# Patient Record
Sex: Female | Born: 1955 | Race: White | Hispanic: No | Marital: Married | Smoking: Current every day smoker
Health system: Southern US, Community
[De-identification: ages and names within clinical notes are randomized; demographics above are authoritative.]

## PROBLEM LIST (undated history)

## (undated) DIAGNOSIS — M1990 Unspecified osteoarthritis, unspecified site: Secondary | ICD-10-CM

## (undated) DIAGNOSIS — E785 Hyperlipidemia, unspecified: Secondary | ICD-10-CM

## (undated) DIAGNOSIS — F329 Major depressive disorder, single episode, unspecified: Secondary | ICD-10-CM

## (undated) DIAGNOSIS — F419 Anxiety disorder, unspecified: Secondary | ICD-10-CM

## (undated) DIAGNOSIS — F32A Depression, unspecified: Secondary | ICD-10-CM

## (undated) HISTORY — DX: Anxiety disorder, unspecified: F41.9

## (undated) HISTORY — DX: Depression, unspecified: F32.A

## (undated) HISTORY — DX: Unspecified osteoarthritis, unspecified site: M19.90

## (undated) HISTORY — DX: Hyperlipidemia, unspecified: E78.5

## (undated) HISTORY — PX: OTHER SURGICAL HISTORY: SHX169

---
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
## 2019-08-21 ENCOUNTER — Encounter: Payer: Self-pay | Admitting: Gastroenterology

## 2019-09-18 ENCOUNTER — Encounter: Payer: Self-pay | Admitting: Gastroenterology

## 2019-09-18 ENCOUNTER — Ambulatory Visit (INDEPENDENT_AMBULATORY_CARE_PROVIDER_SITE_OTHER): Payer: 59 | Admitting: Gastroenterology

## 2019-09-18 ENCOUNTER — Other Ambulatory Visit: Payer: Self-pay

## 2019-09-18 VITALS — BP 122/80 | HR 64 | Temp 97.5°F | Ht 66.5 in | Wt 137.2 lb

## 2019-09-18 DIAGNOSIS — Z01818 Encounter for other preprocedural examination: Secondary | ICD-10-CM

## 2019-09-18 DIAGNOSIS — R195 Other fecal abnormalities: Secondary | ICD-10-CM

## 2019-09-18 NOTE — Progress Notes (Signed)
Chief Complaint: heme positive stools  Referring Provider:  Willeen Niece, PA      ASSESSMENT AND PLAN;   #1.  Heme positive stools with Nl CBC 08/2019 #2.  Comorbid conditions include COPD, anxiety/depression, psoriasis, HLD.  Plan:  - Proceed with colonoscopy. Discussed risks & benefits. (Risks including rare perforation req laparotomy, bleeding after bx/polypectomy req blood transfusion, rarely missing neoplasms, risks of anesthesia/sedation). Benefits outweigh the risks. Patient agrees to proceed. All the questions were answered. Consent forms given for review. -I have instructed patient to stop smoking.  Have discussed risks associated with smoking including risks of various cancers.     HPI:    Sydney House is a 64 y.o. female  With heme positive stools. No nausea, vomiting, heartburn, regurgitation, odynophagia or dysphagia.  No significant diarrhea or constipation.  No melena or hematochezia. No unintentional weight loss. No abdominal pain.  Has left flank pain, not related to BMs, better with flexaril.  Not planning to get Covid shots (I have recommended to get those done-she will discuss with her husband)  Continued smoking despite of medical advice.  No family history of colon cancer or colonic polyps.  Past Medical History:  Diagnosis Date  . Anxiety   . Depression   . Hyperlipidemia     History reviewed. No pertinent surgical history.  Family History  Problem Relation Age of Onset  . Diabetes Sister   . Heart disease Sister   . Diabetes Brother   . Esophageal cancer Neg Hx   . Colon cancer Neg Hx     Social History   Tobacco Use  . Smoking status: Current Every Day Smoker  . Smokeless tobacco: Never Used  Substance Use Topics  . Alcohol use: Not Currently  . Drug use: Never    Current Outpatient Medications  Medication Sig Dispense Refill  . atorvastatin (LIPITOR) 40 MG tablet Take 40 mg by mouth daily.    . busPIRone (BUSPAR) 10 MG  tablet Take 10 mg by mouth 2 (two) times daily.    . cyclobenzaprine (FLEXERIL) 10 MG tablet Take 10 mg by mouth daily as needed.    Marland Kitchen FLUoxetine (PROZAC) 20 MG capsule Take 20 mg by mouth daily.    . fluticasone (FLONASE) 50 MCG/ACT nasal spray Place 1 spray into both nostrils daily.    . meclizine (ANTIVERT) 12.5 MG tablet Take 12.5 mg by mouth daily as needed.     No current facility-administered medications for this visit.    Not on File  Review of Systems:  Constitutional: Denies fever, chills, diaphoresis, appetite change and fatigue.  HEENT: Denies photophobia, eye pain, redness, hearing loss, ear pain, congestion, sore throat, rhinorrhea, sneezing, mouth sores, neck pain, neck stiffness and tinnitus.   Respiratory: Denies SOB, DOE, cough, chest tightness,  and wheezing.   Cardiovascular: Denies chest pain, palpitations and leg swelling.  Genitourinary: Denies dysuria, urgency, frequency, hematuria, flank pain and difficulty urinating.  Musculoskeletal: Denies myalgias, back pain, joint swelling, arthralgias and gait problem.  Skin: rash.  Neurological: Denies dizziness, seizures, syncope, weakness, light-headedness, numbness and headaches.  Hematological: Denies adenopathy. Easy bruising, personal or family bleeding history  Psychiatric/Behavioral: Has anxiety or depression     Physical Exam:    BP 122/80   Pulse 64   Temp (!) 97.5 F (36.4 C)   Ht 5' 6.5" (1.689 m)   Wt 137 lb 4 oz (62.3 kg)   BMI 21.82 kg/m  Wt Readings from Last 3 Encounters:  09/18/19 137 lb 4 oz (62.3 kg)   Constitutional:  Well-developed, in no acute distress. Psychiatric: Normal mood and affect. Behavior is normal. HEENT: Pupils normal.  Conjunctivae are normal. No scleral icterus. Neck supple.  Cardiovascular: Normal rate, regular rhythm. No edema Pulmonary/chest: Effort normal and breath sounds decreased.  No wheezing, rales or rhonchi. Abdominal: Soft, nondistended. Nontender. Bowel  sounds active throughout. There are no masses palpable. No hepatomegaly. Small umblical hernia Rectal:  defered Neurological: Alert and oriented to person place and time. Skin: Skin is warm and dry. No rashes noted.  Data Reviewed: I have personally reviewed following labs and imaging studies     Carmell Austria, MD 09/18/2019, 9:00 AM  Cc: Willeen Niece, [place]

## 2019-09-18 NOTE — Patient Instructions (Signed)
If you are age 62 or older, your body mass index should be between 23-30. Your Body mass index is 21.82 kg/m. If this is out of the aforementioned range listed, please consider follow up with your Primary Care Provider.  If you are age 48 or younger, your body mass index should be between 19-25. Your Body mass index is 21.82 kg/m. If this is out of the aformentioned range listed, please consider follow up with your Primary Care Provider.   You have been scheduled for a colonoscopy. Please follow written instructions given to you at your visit today.  Please pick up your prep supplies at the pharmacy within the next 1-3 days. If you use inhalers (even only as needed), please bring them with you on the day of your procedure. Your physician has requested that you go to www.startemmi.com and enter the access code given to you at your visit today. This web site gives a general overview about your procedure. However, you should still follow specific instructions given to you by our office regarding your preparation for the procedure.  Thank you,  Dr. Jackquline Denmark

## 2019-10-16 ENCOUNTER — Ambulatory Visit (INDEPENDENT_AMBULATORY_CARE_PROVIDER_SITE_OTHER): Payer: 59

## 2019-10-16 ENCOUNTER — Other Ambulatory Visit: Payer: Self-pay | Admitting: Gastroenterology

## 2019-10-16 DIAGNOSIS — Z1159 Encounter for screening for other viral diseases: Secondary | ICD-10-CM

## 2019-10-16 LAB — SARS CORONAVIRUS 2 (TAT 6-24 HRS): SARS Coronavirus 2: NEGATIVE

## 2019-10-18 ENCOUNTER — Ambulatory Visit (AMBULATORY_SURGERY_CENTER): Payer: 59 | Admitting: Gastroenterology

## 2019-10-18 ENCOUNTER — Encounter: Payer: Self-pay | Admitting: Gastroenterology

## 2019-10-18 ENCOUNTER — Other Ambulatory Visit: Payer: Self-pay

## 2019-10-18 VITALS — BP 112/65 | HR 68 | Temp 98.3°F | Resp 20 | Ht 66.5 in | Wt 137.0 lb

## 2019-10-18 DIAGNOSIS — K5730 Diverticulosis of large intestine without perforation or abscess without bleeding: Secondary | ICD-10-CM

## 2019-10-18 DIAGNOSIS — D125 Benign neoplasm of sigmoid colon: Secondary | ICD-10-CM

## 2019-10-18 DIAGNOSIS — R195 Other fecal abnormalities: Secondary | ICD-10-CM

## 2019-10-18 MED ORDER — SODIUM CHLORIDE 0.9 % IV SOLN: 500.0000 mL | Freq: Once | INTRAVENOUS | Status: DC

## 2019-10-18 NOTE — Progress Notes (Signed)
TEMP-LS  V/S-DT

## 2019-10-18 NOTE — Progress Notes (Signed)
Report given to PACU, vss 

## 2019-10-18 NOTE — Patient Instructions (Signed)
YOU HAD AN ENDOSCOPIC PROCEDURE TODAY AT THE [facility]:   Refer to the procedure report that was given to you for any specific questions about what was found during the examination.  If the procedure report does not answer your questions, please call your gastroenterologist to clarify.  If you requested that your care partner not be given the details of your procedure findings, then the procedure report has been included in a sealed envelope for you to review at your convenience later.  YOU SHOULD EXPECT: Some feelings of bloating in the abdomen. Passage of more gas than usual.  Walking can help get rid of the air that was put into your GI tract during the procedure and reduce the bloating. If you had a lower endoscopy (such as a colonoscopy or flexible sigmoidoscopy) you may notice spotting of blood in your stool or on the toilet paper. If you underwent a bowel prep for your procedure, you may not have a normal bowel movement for a few days.  Please Note:  You might notice some irritation and congestion in your nose or some drainage.  This is from the oxygen used during your procedure.  There is no need for concern and it should clear up in a day or so.  SYMPTOMS TO REPORT IMMEDIATELY:   Following lower endoscopy (colonoscopy or flexible sigmoidoscopy):  Excessive amounts of blood in the stool  Significant tenderness or worsening of abdominal pains  Swelling of the abdomen that is new, acute  Fever of 100F or higher  For urgent or emergent issues, a gastroenterologist can be reached at any hour by calling [phone]. Do not use MyChart messaging for urgent concerns.    DIET:  We do recommend a small meal at first, but then you may proceed to your regular diet.  Drink plenty of fluids but you should avoid alcoholic beverages for 24 hours.  ACTIVITY:  You should plan to take it easy for the rest of today and you should NOT DRIVE or use heavy machinery until tomorrow (because  of the sedation medicines used during the test).    FOLLOW UP: Our staff will call the number listed on your records 48-72 hours following your procedure to check on you and address any questions or concerns that you may have regarding the information given to you following your procedure. If we do not reach you, we will leave a message.  We will attempt to reach you two times.  During this call, we will ask if you have developed any symptoms of COVID 19. If you develop any symptoms (ie: fever, flu-like symptoms, shortness of breath, cough etc.) before then, please call [phone].  If you test positive for Covid 19 in the 2 weeks post procedure, please call and report this information to us.    If any biopsies were taken you will be contacted by phone or by letter within the next 1-3 weeks.  Please call us at [phone] if you have not heard about the biopsies in 3 weeks.    SIGNATURES/CONFIDENTIALITY: You and/or your care partner have signed paperwork which will be entered into your electronic medical record.  These signatures attest to the fact that that the information above on your After Visit Summary has been reviewed and is understood.  Full responsibility of the confidentiality of this discharge information lies with you and/or your care-partner. 

## 2019-10-18 NOTE — Progress Notes (Signed)
Called to room to assist during endoscopic procedure.  Patient ID and intended procedure confirmed with present staff. Received instructions for my participation in the procedure from the performing physician.  

## 2019-10-19 ENCOUNTER — Telehealth: Payer: Self-pay | Admitting: Gastroenterology

## 2019-10-19 NOTE — Telephone Encounter (Signed)
Patient called stated she woke up with a lot of back pain and she accidentally took aleve instead of tylenol please advise

## 2019-10-19 NOTE — Telephone Encounter (Signed)
Returned pts call.  She states that she took one aleve for back pain this morning.  She reports no bleeding at this time.  Advised her to not take any additional NSAIDS.  Pt verbalized understanding.

## 2019-10-20 ENCOUNTER — Telehealth: Payer: Self-pay

## 2019-10-20 NOTE — Telephone Encounter (Signed)
  Follow up Call-  Call back number 10/18/2019  Post procedure Call Back phone  # [phone]  Permission to leave phone message Yes     Patient questions:  Do you have a fever, pain , or abdominal swelling? No. Pain Score  0 *  Have you tolerated food without any problems? Yes.    Have you been able to return to your normal activities? Yes.    Do you have any questions about your discharge instructions: Diet   No. Medications  No. Follow up visit  No.  Do you have questions or concerns about your Care? No.  Actions: * If pain score is 4 or above: No action needed, pain <4.  1. Have you developed a fever since your procedure? No  2.   Have you had an respiratory symptoms (SOB or cough) since your procedure? No  3.   Have you tested positive for COVID 19 since your procedure No  4.   Have you had any family members/close contacts diagnosed with the COVID 19 since your procedure?  No  If yes to any of these questions please route to Joylene John, RN and Erenest Rasher, RN

## 2019-10-29 ENCOUNTER — Encounter: Payer: Self-pay | Admitting: Gastroenterology

## 2020-01-09 ENCOUNTER — Ambulatory Visit: Payer: 59 | Admitting: Gastroenterology

## 2021-01-15 ENCOUNTER — Ambulatory Visit: Payer: 59 | Admitting: Gastroenterology

## 2021-06-14 ENCOUNTER — Emergency Department (HOSPITAL_BASED_OUTPATIENT_CLINIC_OR_DEPARTMENT_OTHER)
Admission: EM | Admit: 2021-06-14 | Discharge: 2021-06-14 | Disposition: A | Discharge status: Home / Self Care | Payer: BLUE CROSS/BLUE SHIELD | Attending: Emergency Medicine | Admitting: Emergency Medicine

## 2021-06-14 ENCOUNTER — Emergency Department (HOSPITAL_BASED_OUTPATIENT_CLINIC_OR_DEPARTMENT_OTHER): Payer: BLUE CROSS/BLUE SHIELD

## 2021-06-14 ENCOUNTER — Other Ambulatory Visit: Payer: Self-pay

## 2021-06-14 ENCOUNTER — Encounter (HOSPITAL_BASED_OUTPATIENT_CLINIC_OR_DEPARTMENT_OTHER): Payer: Self-pay | Admitting: *Deleted

## 2021-06-14 DIAGNOSIS — R0602 Shortness of breath: Secondary | ICD-10-CM | POA: Diagnosis not present

## 2021-06-14 DIAGNOSIS — R519 Headache, unspecified: Secondary | ICD-10-CM | POA: Insufficient documentation

## 2021-06-14 DIAGNOSIS — R509 Fever, unspecified: Secondary | ICD-10-CM | POA: Diagnosis not present

## 2021-06-14 DIAGNOSIS — R059 Cough, unspecified: Secondary | ICD-10-CM | POA: Diagnosis not present

## 2021-06-14 DIAGNOSIS — F17210 Nicotine dependence, cigarettes, uncomplicated: Secondary | ICD-10-CM | POA: Diagnosis not present

## 2021-06-14 DIAGNOSIS — R079 Chest pain, unspecified: Secondary | ICD-10-CM | POA: Diagnosis not present

## 2021-06-14 DIAGNOSIS — J441 Chronic obstructive pulmonary disease with (acute) exacerbation: Secondary | ICD-10-CM

## 2021-06-14 LAB — BASIC METABOLIC PANEL
Anion gap: 7 (ref 5–15)
BUN: 16 mg/dL (ref 8–23)
CO2: 28 mmol/L (ref 22–32)
Calcium: 8.9 mg/dL (ref 8.9–10.3)
Chloride: 105 mmol/L (ref 98–111)
Creatinine, Ser: 0.81 mg/dL (ref 0.44–1.00)
GFR, Estimated: >60 mL/min (ref >60)
Glucose, Bld: 91 mg/dL (ref 70–99)
Potassium: 3.7 mmol/L (ref 3.5–5.1)
Sodium: 140 mmol/L (ref 135–145)

## 2021-06-14 LAB — CBC
HCT: 38.2 % (ref 36.0–46.0)
Hemoglobin: 12.9 g/dL (ref 12.0–15.0)
MCH: 32.5 pg (ref 26.0–34.0)
MCHC: 33.8 g/dL (ref 30.0–36.0)
MCV: 96.2 fL (ref 80.0–100.0)
Platelets: 181 10*3/uL (ref 150–400)
RBC: 3.97 MIL/uL (ref 3.87–5.11)
RDW: 14 % (ref 11.5–15.5)
WBC: 5 10*3/uL (ref 4.0–10.5)
nRBC: 0 % (ref 0.0–0.2)

## 2021-06-14 LAB — TROPONIN I (HIGH SENSITIVITY): Troponin I (High Sensitivity): 2 ng/L (ref <18)

## 2021-06-14 MED ORDER — PREDNISONE 10 MG PO TABS: 40.0000 mg | ORAL_TABLET | Freq: Every day | ORAL | 0 refills | Status: AC

## 2021-06-14 MED ORDER — AZITHROMYCIN 250 MG PO TABS: ORAL_TABLET | ORAL | 0 refills | Status: AC

## 2021-06-14 MED ORDER — IOHEXOL 350 MG/ML SOLN: 75.0000 mL | Freq: Once | INTRAVENOUS | Status: DC | PRN

## 2021-06-14 NOTE — ED Provider Notes (Addendum)
[facility] Provider Note   CSN: [id number] Arrival date & time: 06/14/21  1310     Past Medical History:  Diagnosis Date   Anxiety    Arthritis    Depression    Hyperlipidemia     Chief Complaint  Patient presents with   Chest Pain    Sydney House is a 66 y.o. female.   Chest Pain Associated symptoms: cough, fever, headache and shortness of breath   Associated symptoms: no dizziness    Patient is a 66 y.o. female with a 20 + year pack history who is presenting with chest pain that started a week ago. She attributes it to cleaning a house that was filled with mold for work. She feels like her breathing has not been the same since. She has been coughing and has pain when she takes a deep breath but not been making any mucous or sputum. Has had one subjective fever. No other associated symptoms.     Home Medications Prior to Admission medications   Medication Sig Start Date End Date Taking? Authorizing Provider  atorvastatin (LIPITOR) 40 MG tablet Take 40 mg by mouth daily. 09/01/19   [provider]  busPIRone (BUSPAR) 10 MG tablet Take 10 mg by mouth 2 (two) times daily. 08/16/19   [provider]  cyclobenzaprine (FLEXERIL) 10 MG tablet Take 10 mg by mouth daily as needed. 08/16/19   [provider]  FLUoxetine (PROZAC) 20 MG capsule Take 20 mg by mouth daily. 08/11/19   [provider]  fluticasone (FLONASE) 50 MCG/ACT nasal spray Place 1 spray into both nostrils daily.    [provider]  meclizine (ANTIVERT) 12.5 MG tablet Take 12.5 mg by mouth daily as needed. 08/11/19   [provider]     Social History   Socioeconomic History   Marital status: Married    Spouse name: Not on file   Number of children: Not on file   Years of education: Not on file   Highest education level: Not on file  Occupational History   Not on file  Tobacco Use   Smoking status: Every Day    Types: Cigarettes    Smokeless tobacco: Never  Vaping Use   Vaping Use: Never used  Substance and Sexual Activity   Alcohol use: Not Currently   Drug use: Never   Sexual activity: Not on file  Other Topics Concern   Not on file  Social History Narrative   Not on file   Social Determinants of Health   Financial Resource Strain: Not on file  Food Insecurity: Not on file  Transportation Needs: Not on file  Physical Activity: Not on file  Stress: Not on file  Social Connections: Not on file  Intimate Partner Violence: Not on file     Allergies    Patient has no known allergies.    Review of Systems   Review of Systems  Constitutional:  Positive for fever.  Respiratory:  Positive for cough, chest tightness and shortness of breath.   Cardiovascular:  Positive for chest pain. Negative for leg swelling.  Neurological:  Positive for headaches. Negative for dizziness.  All other systems reviewed and are negative.  Physical Exam Updated Vital Signs BP 110/62    Pulse (!) 51    Temp 98 F (36.7 C) (Oral)    Resp 18    Ht 5\' 6"  (1.676 m)    Wt 59 kg    SpO2 99%  BMI 20.98 kg/m  Physical Exam Constitutional:      General: She is not in acute distress.    Appearance: She is well-developed.  HENT:     Head: Normocephalic and atraumatic.  Cardiovascular:     Rate and Rhythm: Normal rate and regular rhythm.     Heart sounds: Heart sounds are distant. No murmur heard. Pulmonary:     Effort: Pulmonary effort is normal. Prolonged expiration present.     Breath sounds: Examination of the left-middle field reveals rhonchi. Examination of the left-lower field reveals rhonchi. Rhonchi present.  Neurological:     Mental Status: She is alert.    ED Results / Procedures / Treatments   Labs (all labs ordered are listed, but only abnormal results are displayed) Labs Reviewed  BASIC METABOLIC PANEL  CBC  TROPONIN I (HIGH SENSITIVITY)  TROPONIN I (HIGH SENSITIVITY)    EKG EKG  Interpretation  Date/Time:  Saturday June 14 2021 13:21:15 EST Ventricular Rate:  60 PR Interval:  141 QRS Duration: 89 QT Interval:  429 QTC Calculation: 429 R Axis:   -54 Text Interpretation: Sinus rhythm Probable left atrial enlargement Left anterior fascicular block No previous ECGs available Confirmed by Gareth Morgan [phone]) on 06/14/2021 1:41:24 PM  Radiology DG Chest 2 View  Result Date: 06/14/2021 CLINICAL DATA:  Cough and chest pain. Recent mold exposure. EXAM: CHEST - 2 VIEW COMPARISON:  Chest x-ray dated March 04, 2018. FINDINGS: The heart size and mediastinal contours are within normal limits. Normal pulmonary vascularity. The lungs remain hyperinflated. No focal consolidation, pleural effusion, or pneumothorax. No acute osseous abnormality. IMPRESSION: 1. No acute cardiopulmonary disease. 2. COPD. Electronically Signed   By: Titus Dubin M.D.   On: 06/14/2021 13:48    Procedures Procedures   Medications Ordered in ED Medications - No data to display  ED Course/ Medical Decision Making/ A&P                           Medical Decision Making  CXR reviewed by me shows no signs of acute cardiopulmonary disease but is suggestive of COPD. Patient's lab work is overall reassuring with normal CBC, BMP, negative troponins. Patient is saturating well on room air and remains afebrile. Given her symptoms patient was offered a CT to better assess for a pneumonia or a blood clot given her pleuritic pain and recent COVID infection but she declined. She will be prescribed a course of antibiotics and steroids. If her difficulty breathing, chest pain, or shortness of breath worsens she will be instructed to seek further medical care.  Final Clinical Impression(s) / ED Diagnoses Final diagnoses:  None    Rx / DC Orders ED Discharge Orders     None         Scarlett Presto, MD 06/14/21 1509    Scarlett Presto, MD 06/14/21 [place], [place], DO 06/14/21 1536

## 2021-06-14 NOTE — ED Triage Notes (Addendum)
Pt states she cleaned a mold infested house 2 weeks ago without PPE. Since then she has been having a non-productive cough. Reports chest pain starting yesterday across upper chest. Also reports headache today. States she had covid in Dec

## 2021-06-14 NOTE — Discharge Instructions (Addendum)
Sydney House  You were recently seen at [facility] emergency room for chest pain. We did an EKG and took a chest XR which did not show any signs of pneumonia. We will prescribe you a short course of steroids and antibiotics to help with your breathing. We offered you a CT scan but you declined. If you have worsening shortness of breath, dizziness, chest pain, or fever we recommend that you seek further medical care.

## 2021-06-14 NOTE — ED Notes (Signed)
Pt verbalized understanding to pick up prescriptions at pharmacy listed on d/c instructions.

## 2021-08-06 ENCOUNTER — Encounter (HOSPITAL_BASED_OUTPATIENT_CLINIC_OR_DEPARTMENT_OTHER): Payer: Self-pay | Admitting: Emergency Medicine

## 2021-08-06 ENCOUNTER — Emergency Department (HOSPITAL_BASED_OUTPATIENT_CLINIC_OR_DEPARTMENT_OTHER): Payer: BLUE CROSS/BLUE SHIELD

## 2021-08-06 ENCOUNTER — Emergency Department (HOSPITAL_BASED_OUTPATIENT_CLINIC_OR_DEPARTMENT_OTHER)
Admission: EM | Admit: 2021-08-06 | Discharge: 2021-08-06 | Disposition: A | Discharge status: Home / Self Care | Payer: BLUE CROSS/BLUE SHIELD | Attending: Emergency Medicine | Admitting: Emergency Medicine

## 2021-08-06 ENCOUNTER — Other Ambulatory Visit: Payer: Self-pay

## 2021-08-06 DIAGNOSIS — R1031 Right lower quadrant pain: Secondary | ICD-10-CM | POA: Insufficient documentation

## 2021-08-06 DIAGNOSIS — K921 Melena: Secondary | ICD-10-CM | POA: Insufficient documentation

## 2021-08-06 DIAGNOSIS — R110 Nausea: Secondary | ICD-10-CM | POA: Insufficient documentation

## 2021-08-06 DIAGNOSIS — R195 Other fecal abnormalities: Secondary | ICD-10-CM

## 2021-08-06 LAB — URINALYSIS, MICROSCOPIC (REFLEX)
Squamous Epithelial / HPF: NONE SEEN (ref 0–5)
WBC, UA: NONE SEEN WBC/hpf (ref 0–5)

## 2021-08-06 LAB — COMPREHENSIVE METABOLIC PANEL
ALT: 17 U/L (ref 0–44)
AST: 22 U/L (ref 15–41)
Albumin: 4.7 g/dL (ref 3.5–5.0)
Alkaline Phosphatase: 75 U/L (ref 38–126)
Anion gap: 10 (ref 5–15)
BUN: 18 mg/dL (ref 8–23)
CO2: 30 mmol/L (ref 22–32)
Calcium: 9.5 mg/dL (ref 8.9–10.3)
Chloride: 102 mmol/L (ref 98–111)
Creatinine, Ser: 0.88 mg/dL (ref 0.44–1.00)
GFR, Estimated: >60 mL/min (ref >60)
Glucose, Bld: 103 mg/dL — ABNORMAL HIGH (ref 70–99)
Potassium: 3.3 mmol/L — ABNORMAL LOW (ref 3.5–5.1)
Sodium: 142 mmol/L (ref 135–145)
Total Bilirubin: 0.5 mg/dL (ref 0.3–1.2)
Total Protein: 7.6 g/dL (ref 6.5–8.1)

## 2021-08-06 LAB — CBC WITH DIFFERENTIAL/PLATELET
Abs Immature Granulocytes: 0.02 10*3/uL (ref 0.00–0.07)
Basophils Absolute: 0 10*3/uL (ref 0.0–0.1)
Basophils Relative: 1 %
Eosinophils Absolute: 0.1 10*3/uL (ref 0.0–0.5)
Eosinophils Relative: 1 %
HCT: 43.8 % (ref 36.0–46.0)
Hemoglobin: 14.8 g/dL (ref 12.0–15.0)
Immature Granulocytes: 0 %
Lymphocytes Relative: 39 %
Lymphs Abs: 3.4 10*3/uL (ref 0.7–4.0)
MCH: 32.6 pg (ref 26.0–34.0)
MCHC: 33.8 g/dL (ref 30.0–36.0)
MCV: 96.5 fL (ref 80.0–100.0)
Monocytes Absolute: 0.6 10*3/uL (ref 0.1–1.0)
Monocytes Relative: 7 %
Neutro Abs: 4.6 10*3/uL (ref 1.7–7.7)
Neutrophils Relative %: 52 %
Platelets: 202 10*3/uL (ref 150–400)
RBC: 4.54 MIL/uL (ref 3.87–5.11)
RDW: 13.6 % (ref 11.5–15.5)
WBC: 8.7 10*3/uL (ref 4.0–10.5)
nRBC: 0 % (ref 0.0–0.2)

## 2021-08-06 LAB — URINALYSIS, ROUTINE W REFLEX MICROSCOPIC
Bilirubin Urine: NEGATIVE
Glucose, UA: NEGATIVE mg/dL
Ketones, ur: NEGATIVE mg/dL
Leukocytes,Ua: NEGATIVE
Nitrite: NEGATIVE
Protein, ur: NEGATIVE mg/dL
Specific Gravity, Urine: 1.015 (ref 1.005–1.030)
pH: 7 (ref 5.0–8.0)

## 2021-08-06 LAB — OCCULT BLOOD X 1 CARD TO LAB, STOOL: Fecal Occult Bld: NEGATIVE

## 2021-08-06 LAB — LIPASE, BLOOD: Lipase: 32 U/L (ref 11–51)

## 2021-08-06 MED ORDER — IOHEXOL 300 MG/ML  SOLN
100.0000 mL | Freq: Once | INTRAMUSCULAR | Status: AC | PRN
  Administered 2021-08-06: 100 mL via INTRAVENOUS

## 2021-08-06 MED ORDER — ONDANSETRON HCL 4 MG/2ML IJ SOLN
4.0000 mg | Freq: Once | INTRAMUSCULAR | Status: AC
Start: 2021-08-06 — End: 2021-08-06
  Administered 2021-08-06: 4 mg via INTRAVENOUS
  Filled 2021-08-06: qty 2

## 2021-08-06 MED ORDER — LACTATED RINGERS IV SOLN: INTRAVENOUS | Status: DC

## 2021-08-06 NOTE — ED Provider Notes (Signed)
[facility] EMERGENCY DEPARTMENT Provider Note   CSN: [id number] Arrival date & time: 08/06/21  1103     History  Chief Complaint  Patient presents with   Melena    Sydney House is a 66 y.o. female.  HPI Patient reports that a week ago she was working cleaning a bathroom and squatting down when she was startled by the home motor coming in the bathroom and stood up very quickly.  She reports at that time she had a sudden and severe pain in her right far lateral mid or upper abdomen.  She reports that was so painful she had to double over again.  She subsequently went to urgent care and was diagnosed with a pulled muscle.  She reports they gave her prednisone and a muscle relaxer to take.  She reports it did not seem to be helping much but then she noted that the pain migrated more to her right lower and lower central abdomen.  The meantime she took some Pepto-Bismol because her stomach felt upset.  She did have very dark stool and called her PCP about getting a follow-up for worsening or changing abdominal pain.  She was recommended to come to the emergency department for evaluation with CT scan and labs.  Patient denies any vomiting.  She reports she is not really eating though because she feels nauseated.  No prior history of GI bleed.  She reports she had a colonoscopy about 3 years ago and had a number of polyps removed but no history of colon cancer.    Home Medications Prior to Admission medications   Medication Sig Start Date End Date Taking? Authorizing Provider  atorvastatin (LIPITOR) 40 MG tablet Take 40 mg by mouth daily. 09/01/19   [provider]  busPIRone (BUSPAR) 10 MG tablet Take 10 mg by mouth 2 (two) times daily. 08/16/19   [provider]  cyclobenzaprine (FLEXERIL) 10 MG tablet Take 10 mg by mouth daily as needed. 08/16/19   [provider]  FLUoxetine (PROZAC) 20 MG capsule Take 20 mg by mouth daily. 08/11/19   [provider]   fluticasone (FLONASE) 50 MCG/ACT nasal spray Place 1 spray into both nostrils daily.    [provider]  meclizine (ANTIVERT) 12.5 MG tablet Take 12.5 mg by mouth daily as needed. 08/11/19   [provider]      Allergies    Patient has no known allergies.    Review of Systems   Review of Systems 10 systems reviewed and negative except as per HPI Physical Exam Updated Vital Signs BP 117/70    Pulse (!) 56    Temp 97.9 F (36.6 C) (Oral)    Resp 18    Wt 56.7 kg    SpO2 98%    BMI 20.18 kg/m  Physical Exam Constitutional:      Appearance: Normal appearance.  HENT:     Mouth/Throat:     Pharynx: Oropharynx is clear.  Eyes:     Extraocular Movements: Extraocular movements intact.  Cardiovascular:     Rate and Rhythm: Normal rate and regular rhythm.  Pulmonary:     Effort: Pulmonary effort is normal.     Breath sounds: Normal breath sounds.  Abdominal:     Comments: Abdomen soft.  Mild right lateral and lower tenderness to palpation.  No guarding.  Genitourinary:    Comments: Small amount of formed stool in the vault.  No melena.  No frank blood Musculoskeletal:  General: No swelling or tenderness. Normal range of motion.     Right lower leg: No edema.     Left lower leg: No edema.  Skin:    General: Skin is warm and dry.  Neurological:     General: No focal deficit present.     Mental Status: She is alert and oriented to person, place, and time.     Coordination: Coordination normal.  Psychiatric:        Mood and Affect: Mood normal.    ED Results / Procedures / Treatments   Labs (all labs ordered are listed, but only abnormal results are displayed) Labs Reviewed  COMPREHENSIVE METABOLIC PANEL - Abnormal; Notable for the following components:      Result Value   Potassium 3.3 (*)    Glucose, Bld 103 (*)    All other components within normal limits  URINALYSIS, ROUTINE W REFLEX MICROSCOPIC - Abnormal; Notable for the following components:    Hgb urine dipstick TRACE (*)    All other components within normal limits  URINALYSIS, MICROSCOPIC (REFLEX) - Abnormal; Notable for the following components:   Bacteria, UA RARE (*)    All other components within normal limits  CBC WITH DIFFERENTIAL/PLATELET  LIPASE, BLOOD  OCCULT BLOOD X 1 CARD TO LAB, STOOL    EKG None  Radiology CT Abdomen Pelvis W Contrast  Result Date: 08/06/2021 CLINICAL DATA:  RIGHT lower quadrant abdominal pain, melena for 1 week, generalized pain and nausea EXAM: CT ABDOMEN AND PELVIS WITH CONTRAST TECHNIQUE: Multidetector CT imaging of the abdomen and pelvis was performed using the standard protocol following bolus administration of intravenous contrast. RADIATION DOSE REDUCTION: This exam was performed according to the departmental dose-optimization program which includes automated exposure control, adjustment of the mA and/or kV according to patient size and/or use of iterative reconstruction technique. CONTRAST:  18mL OMNIPAQUE IOHEXOL 300 MG/ML SOLN IV. No oral contrast. COMPARISON:  08/21/2011 FINDINGS: Lower chest: Minimal dependent atelectasis RIGHT lower lobe. Hepatobiliary: Gallbladder and liver normal appearance Pancreas: Normal appearance Spleen: Normal appearance Adrenals/Urinary Tract: Tiny cyst LEFT kidney. Adrenal glands, kidneys, ureters, and bladder normal appearance Stomach/Bowel: Normal appendix. Stomach and bowel loops normal appearance. Vascular/Lymphatic: Atherosclerotic calcifications aorta and iliac arteries. Aorta normal caliber. Vascular structures patent. No adenopathy. Reproductive: Unremarkable uterus. Numerous veins in LEFT LEFT pelvis adjacent to uterus, nonspecific but can be seen with pelvic congestion syndrome. Adnexa otherwise unremarkable. Other: No free air or free fluid. Small umbilical hernia containing fat. No inflammatory process. Musculoskeletal: Bones demineralized. IMPRESSION: Small umbilical hernia containing fat. Numerous veins  in LEFT pelvis adjacent to uterus, nonspecific but can be seen with pelvic congestion syndrome. No acute intra-abdominal or intrapelvic abnormalities. Aortic Atherosclerosis (ICD10-I70.0). Electronically Signed   By: Lavonia Dana M.D.   On: 08/06/2021 12:56    Procedures Procedures    Medications Ordered in ED Medications  lactated ringers infusion (0 mLs Intravenous Stopped 08/06/21 1331)  ondansetron (ZOFRAN) injection 4 mg (4 mg Intravenous Given 08/06/21 1158)  iohexol (OMNIPAQUE) 300 MG/ML solution 100 mL (100 mLs Intravenous Contrast Given 08/06/21 1232)    ED Course/ Medical Decision Making/ A&P                           Medical Decision Making Amount and/or Complexity of Data Reviewed Labs: ordered. Radiology: ordered.  Risk Prescription drug management.   Patient was recommended to come to the emergency department by her PCP office for evaluation of  dark or bloody looking stool.  Patient does not have prior history of GI bleed.  No syncope or near syncope.  Symptoms were identified to have started after a sudden and very severe pain from abruptly standing up.  Diagnostic evaluation initiated for abdominal pain and possible GI bleed.  CBC is stable.  Labs all reviewed by myself no acute findings.  Rectal exam does not show any melena.  History indicates patient did take a dose of Pepto-Bismol which may account for her dark stool.  Nonetheless, patient does have history of polyps removed with colonoscopy.  Patient is made aware she must follow-up with her PCP to discuss need for repeat colonoscopy based on presentation or possible scheduled repeat based on prior polyps.  CT scan identified incidental finding of pelvic vein congestion on the left.  This appears unrelated to the patient's presentation.  This appears most likely to be a benign finding however patient is made aware that she must follow-up with her PCP and GYN for any further recommended evaluation.  Patient is stable for  continued outpatient management.  Her vital signs are stable and diagnostic work-up does not identify an emergent condition.        Final Clinical Impression(s) / ED Diagnoses Final diagnoses:  Right lower quadrant abdominal pain  Dark stools    Rx / DC Orders ED Discharge Orders     None         Charlesetta Shanks, MD 08/06/21 1333

## 2021-08-06 NOTE — ED Triage Notes (Signed)
Pt reports melena x 1 week , seen by PCP , here for CT testing.  ?Generalized  abd pain , nausea , no emesis . ? ?

## 2021-08-06 NOTE — Discharge Instructions (Addendum)
1.  At this time your CT scan does not show any abnormalities that appear related to your pain.  There is an incidental finding of "congestion of the pelvic veins" on the left lower abdomen.  Typically, this is not a serious finding but it is still very important you follow-up with your family physician and gynecologist to review this finding and any further testing or surveillance that they recommend. ?2.  At this time your blood counts are normal and there is no sign of significant blood loss.  However you may need a repeat colonoscopy, address this with your family physician. ?3.  Your dark stool may have been due to up to Pepto-Bismol.  It can turn stools dark or black in appearance. ?4.  Return to emergency department if you have suddenly worsening or more severe pain or other concerning symptoms ?

## 2022-09-20 IMAGING — CT CT ABD-PELV W/ CM
2 of 5 series · 16 of 46 positions shown, 18 images · IV contrast (Omnipaque)
Comparison: 08/21/2011

CLINICAL DATA: RIGHT lower quadrant abdominal pain, melena for 1
week, generalized pain and nausea

EXAM:
CT ABDOMEN AND PELVIS WITH CONTRAST
TECHNIQUE: Multidetector CT imaging of the abdomen and pelvis was performed
using the standard protocol following bolus administration of
intravenous contrast.

[Series 2: axial st · axial · 0.79mm/px · z∈[-469,-89]mm · 13 of 86 slices shown, 15 images]
[im 5/86  soft-tissue]
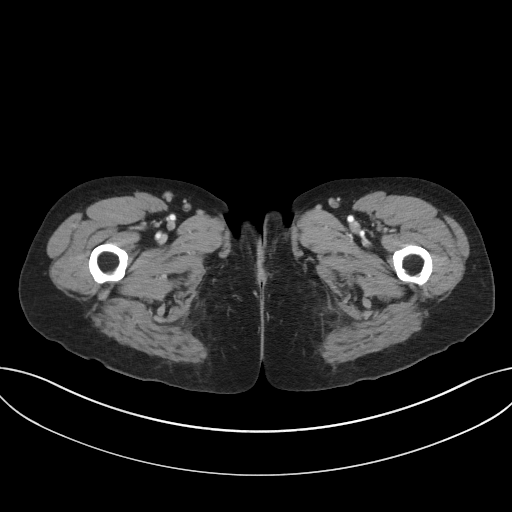
[im 5/86  bone]
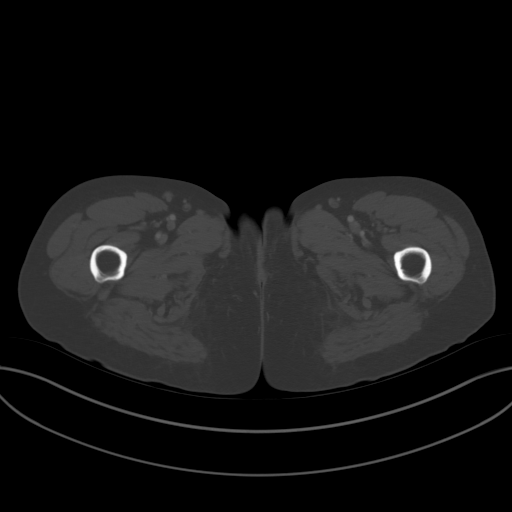
[im 14/86  soft-tissue]
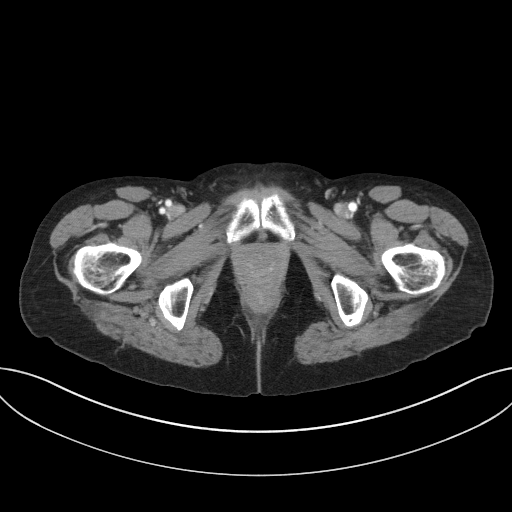
[im 18/86  soft-tissue]
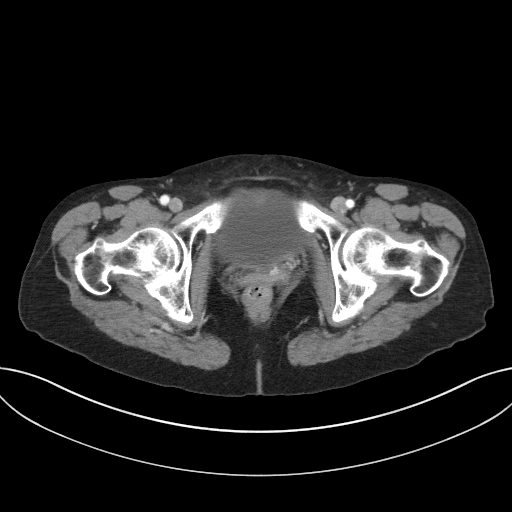
[im 23/86  soft-tissue]
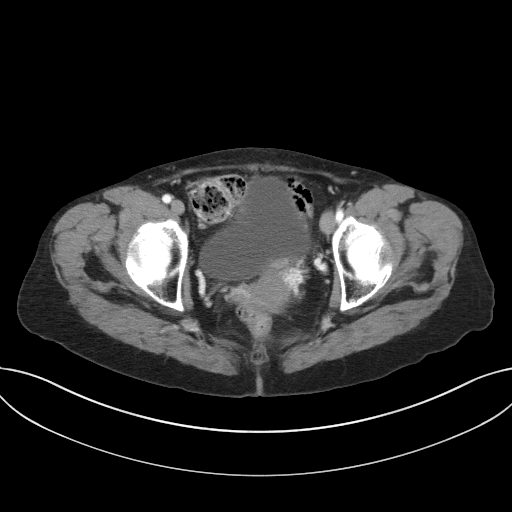
[im 32/86  soft-tissue]
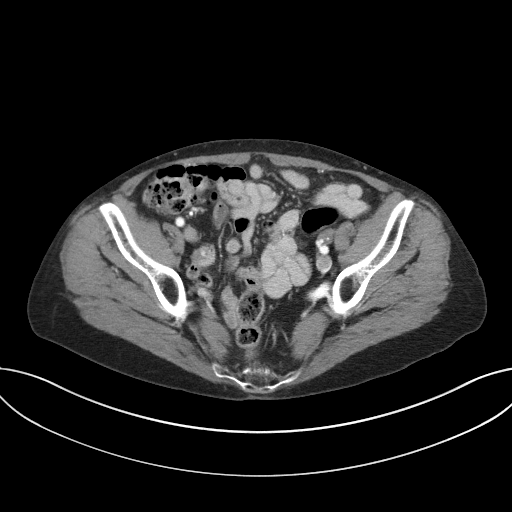
[im 36/86  soft-tissue]
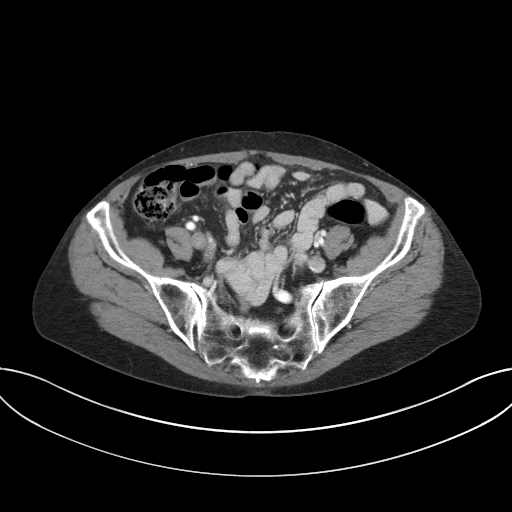
[im 45/86  soft-tissue]
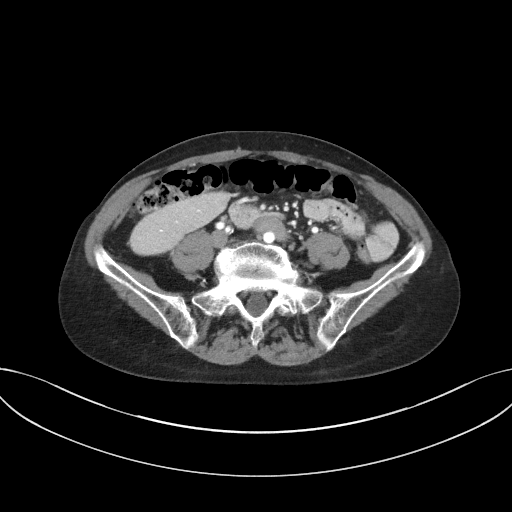
[im 50/86  soft-tissue]
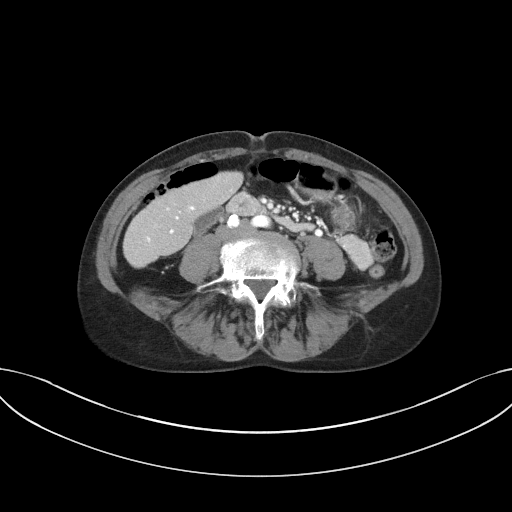
[im 54/86  soft-tissue]
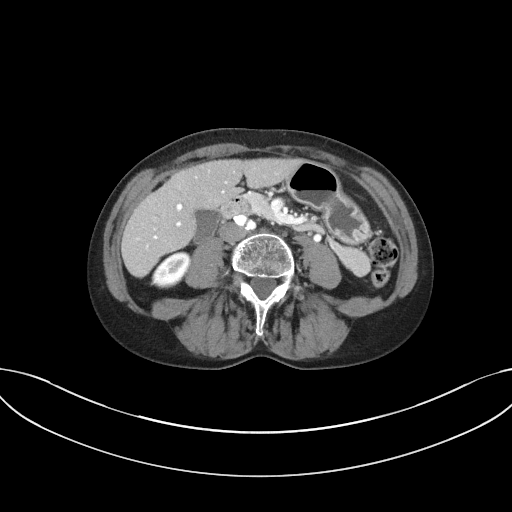
[im 54/86  bone]
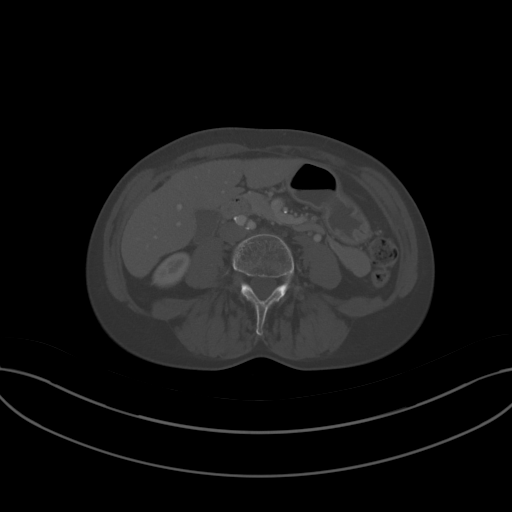
[im 63/86  soft-tissue]
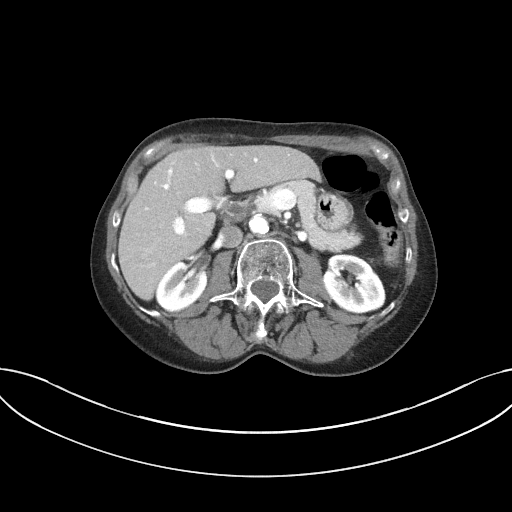
[im 68/86  soft-tissue]
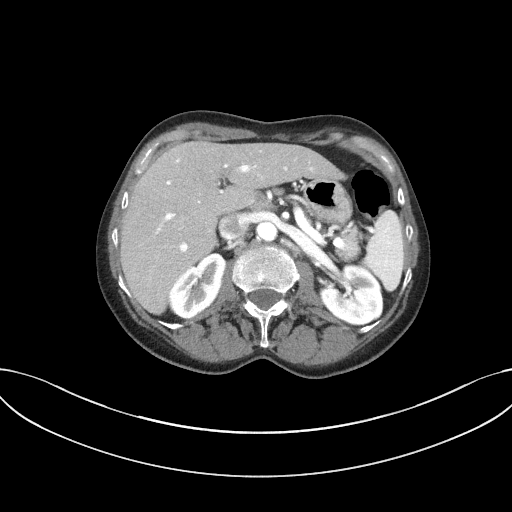
[im 72/86  soft-tissue]
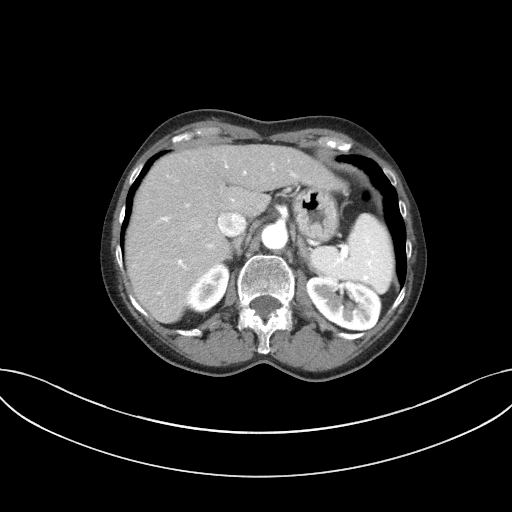
[im 81/86  soft-tissue]
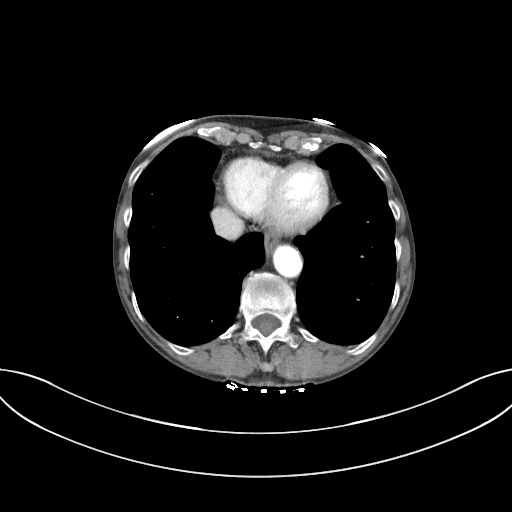

[Series 5: coronal st · coronal · 0.75mm/px · 3 of 76 slices shown]
[im 26/76  soft-tissue]
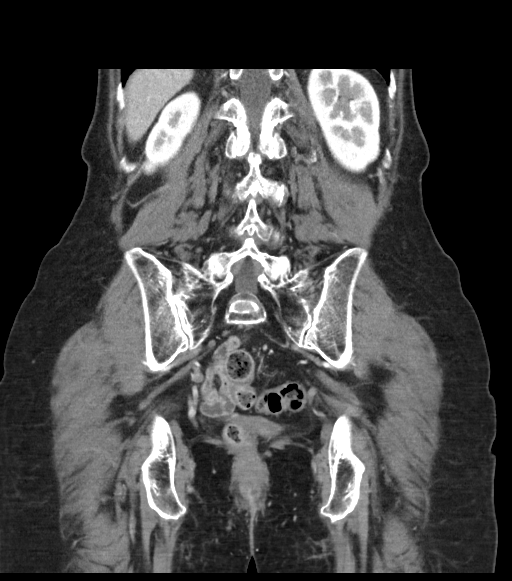
[im 34/76  soft-tissue]
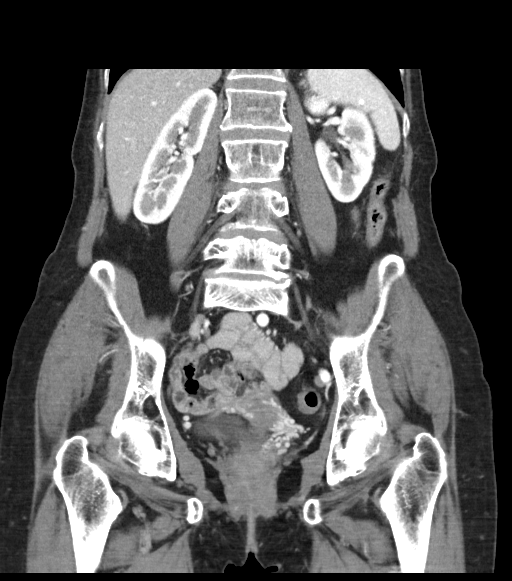
[im 42/76  soft-tissue]
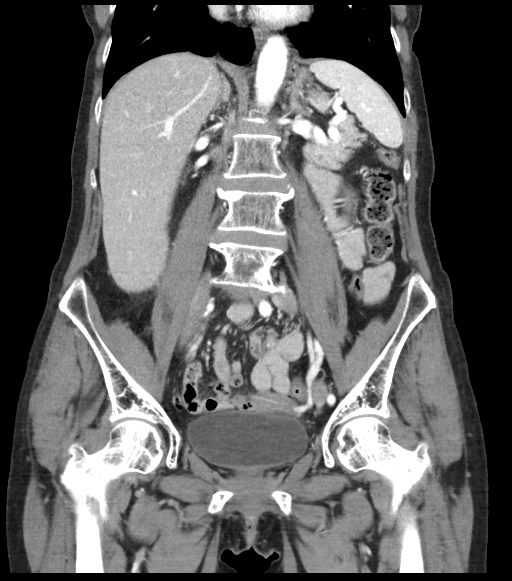

[16 of 46 positions shown; findings below may reference images not displayed]

RADIATION DOSE REDUCTION: This exam was performed according to the
departmental dose-optimization program which includes automated
exposure control, adjustment of the mA and/or kV according to
patient size and/or use of iterative reconstruction technique.

CONTRAST:  100mL OMNIPAQUE IOHEXOL 300 MG/ML SOLN IV. No oral
contrast.
FINDINGS: Lower chest: Minimal dependent atelectasis RIGHT lower lobe.

Hepatobiliary: Gallbladder and liver normal appearance

Pancreas: Normal appearance

Spleen: Normal appearance

Adrenals/Urinary Tract: Tiny cyst LEFT kidney. Adrenal glands,
kidneys, ureters, and bladder normal appearance

Stomach/Bowel: Normal appendix. Stomach and bowel loops normal
appearance.

Vascular/Lymphatic: Atherosclerotic calcifications aorta and iliac
arteries. Aorta normal caliber. Vascular structures patent. No
adenopathy.

Reproductive: Unremarkable uterus. Numerous veins in LEFT LEFT
pelvis adjacent to uterus, nonspecific but can be seen with pelvic
congestion syndrome. Adnexa otherwise unremarkable.

Other: No free air or free fluid. Small umbilical hernia containing
fat. No inflammatory process.

Musculoskeletal: Bones demineralized.
IMPRESSION: Small umbilical hernia containing fat.

Numerous veins in LEFT pelvis adjacent to uterus, nonspecific but
can be seen with pelvic congestion syndrome.

No acute intra-abdominal or intrapelvic abnormalities.

Aortic Atherosclerosis ([id number]).

## 2023-05-03 ENCOUNTER — Encounter: Payer: Self-pay | Admitting: Gastroenterology
